# Patient Record
Sex: Male | Born: 1995 | Race: White | Hispanic: No | Marital: Single | Smoking: Never smoker
Health system: Southern US, Community
[De-identification: ages and names within clinical notes are randomized; demographics above are authoritative.]

---
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
## 2020-09-13 ENCOUNTER — Ambulatory Visit
Admission: EM | Admit: 2020-09-13 | Discharge: 2020-09-13 | Disposition: A | Discharge status: Home / Self Care | Payer: Self-pay | Attending: Family Medicine | Admitting: Family Medicine

## 2020-09-13 ENCOUNTER — Other Ambulatory Visit: Payer: Self-pay

## 2020-09-13 DIAGNOSIS — S0181XA Laceration without foreign body of other part of head, initial encounter: Secondary | ICD-10-CM

## 2020-09-13 MED ORDER — KETOROLAC TROMETHAMINE 10 MG PO TABS: 10.0000 mg | ORAL_TABLET | Freq: Four times a day (QID) | ORAL | 0 refills | Status: AC | PRN

## 2020-09-13 NOTE — Discharge Instructions (Signed)
Keep clean.  Medication as needed for pain.  Rest. Ice.   Sutures out in 5-7 days.  Take care  Dr. Adriana Simas

## 2020-09-13 NOTE — ED Provider Notes (Signed)
[facility]    CSN: [id number] Arrival date & time: 09/13/20  1151      History   Chief Complaint Chief Complaint  Patient presents with  . Facial Injury   HPI  24 year old male presents with the above complaint.   Patient states that last night he was with some friends. He states that he was "running around" and inadvertently tripped and hit his face on a metal pole. He has a laceration to the bridge of the nose and to the left cheek (near the nasolabial fold). Bleeding well controlled at this time. Tetanus was given in the past 2-3 years per his report. He reports headache and associated facial pain. Pain is 9/10 in severity.   Home Medications    Prior to Admission medications   Medication Sig Start Date End Date Taking? Authorizing Provider  DULoxetine (CYMBALTA) 30 MG capsule Take 1 capsule (30 mg total) by mouth 2 (two) times daily. 04/30/15   Gayland Curry, MD  ketorolac (TORADOL) 10 MG tablet Take 1 tablet (10 mg total) by mouth every 6 (six) hours as needed for moderate pain or severe pain. 09/13/20   Tommie Sams, DO  lisdexamfetamine (VYVANSE) 30 MG capsule Take 1 capsule (30 mg total) by mouth daily after breakfast. 04/30/15   Gayland Curry, MD  ranitidine (ZANTAC) 150 MG tablet Take 150 mg by mouth daily.     [provider]    Family History Family History  Problem Relation Age of Onset  . Cancer Mother   . Healthy Father     Social History Social History   Tobacco Use  . Smoking status: Never Smoker  . Smokeless tobacco: Never Used  Substance Use Topics  . Alcohol use: No  . Drug use: No     Allergies   Shellfish allergy, Other, and Sunflower seed [sunflower oil]   Review of Systems Review of Systems  Skin: Positive for wound.   Physical Exam Triage Vital Signs ED Triage Vitals  Enc Vitals Group     BP 09/13/20 1217 106/64     Pulse Rate 09/13/20 1217 77     Resp 09/13/20 1217 16     Temp 09/13/20 1217  98.6 F (37 C)     Temp Source 09/13/20 1217 Oral     SpO2 09/13/20 1217 99 %     Weight --      Height --      Head Circumference --      Peak Flow --      Pain Score 09/13/20 1215 9     Pain Loc --      Pain Edu? --      Excl. in GC? --    Updated Vital Signs BP 106/64 (BP Location: Left Arm)   Pulse 77   Temp 98.6 F (37 C) (Oral)   Resp 16   SpO2 99%   Visual Acuity Right Eye Distance:   Left Eye Distance:   Bilateral Distance:    Right Eye Near:   Left Eye Near:    Bilateral Near:     Physical Exam Constitutional:      Comments: Thin male, NAD.  HENT:     Nose:     Comments: Curvilinear laceration (1.5 cm) at the bridge of the nose.  Maxillary tenderness.  Small laceration to the left cheek near the nasolabial fold.     Mouth/Throat:     Pharynx: Oropharynx is clear.  Eyes:  General:        Right eye: No discharge.        Left eye: No discharge.     Conjunctiva/sclera: Conjunctivae normal.  Cardiovascular:     Rate and Rhythm: Normal rate and regular rhythm.  Pulmonary:     Effort: Pulmonary effort is normal. No respiratory distress.  Neurological:     Mental Status: He is alert.    UC Treatments / Results  Labs (all labs ordered are listed, but only abnormal results are displayed) Labs Reviewed - No data to display  EKG   Radiology No results found.  Procedures Laceration Repair  Date/Time: 09/13/2020 11:05 PM Performed by: Tommie Sams, DO Authorized by: Tommie Sams, DO   Consent:    Consent obtained:  Verbal   Consent given by:  Patient Anesthesia (see MAR for exact dosages):    Anesthesia method:  Local infiltration   Local anesthetic:  Lidocaine 1% WITH epi Laceration details:    Location:  Face   Face location:  Nose   Length (cm):  1.5 Repair type:    Repair type:  Simple Pre-procedure details:    Preparation:  Patient was prepped and draped in usual sterile fashion Exploration:    Hemostasis achieved with:  Direct  pressure   Contaminated: no   Treatment:    Area cleansed with:  Betadine   Amount of cleaning:  Standard Skin repair:    Repair method:  Sutures   Suture size:  6-0   Suture material:  Prolene   Suture technique:  Simple interrupted   Number of sutures:  5 Approximation:    Approximation:  Close Post-procedure details:    Patient tolerance of procedure:  Tolerated well, no immediate complications Laceration Repair  Date/Time: 09/13/2020 11:07 PM Performed by: Tommie Sams, DO Authorized by: Tommie Sams, DO   Consent:    Consent obtained:  Verbal   Consent given by:  Patient Anesthesia (see MAR for exact dosages):    Anesthesia method:  None Laceration details:    Location:  Face   Face location:  L cheek   Length (cm):  0.5 Repair type:    Repair type:  Simple Exploration:    Hemostasis achieved with:  Direct pressure   Contaminated: no   Treatment:    Area cleansed with:  Betadine Skin repair:    Repair method:  Tissue adhesive Approximation:    Approximation:  Close Post-procedure details:    Patient tolerance of procedure:  Tolerated well, no immediate complications   (including critical care time)  Medications Ordered in UC Medications - No data to display  Initial Impression / Assessment and Plan / UC Course  I have reviewed the triage vital signs and the nursing notes.  Pertinent labs & imaging results that were available during my care of the patient were reviewed by me and considered in my medical decision making (see chart for details).    24 year old male presents with 2 facial lacerations (bridge of nose and left cheek). Closed as above. Sutures out in 5-7 days. Toradol as needed for pain. Discussed imaging and patient elected to not proceed with imaging.   Final Clinical Impressions(s) / UC Diagnoses   Final diagnoses:  Facial laceration, initial encounter     Discharge Instructions     Keep clean.  Medication as needed for  pain.  Rest. Ice.   Sutures out in 5-7 days.  Take care  Dr. Adriana Simas  ED Prescriptions    Medication Sig Dispense Auth. Provider   ketorolac (TORADOL) 10 MG tablet Take 1 tablet (10 mg total) by mouth every 6 (six) hours as needed for moderate pain or severe pain. 20 tablet Tommie Sams, DO     PDMP not reviewed this encounter.   Tommie Sams, [place] 09/13/20 2318

## 2020-09-13 NOTE — ED Triage Notes (Signed)
Patient presents to Urgent Care with complaints of falling into a block since last night. Patient reports he is not sure if he passed out after it happened, states it took a while before he could hear people talking after he fell. Pt has a bandage over the wound upon arrival, states it has been bleeding since it happened. Thinks he is up to date on his tetanus shot.

## 2020-09-23 ENCOUNTER — Ambulatory Visit
Admission: EM | Admit: 2020-09-23 | Discharge: 2020-09-23 | Disposition: A | Discharge status: Home / Self Care | Payer: Self-pay

## 2020-09-23 ENCOUNTER — Other Ambulatory Visit: Payer: Self-pay

## 2020-09-23 NOTE — ED Notes (Signed)
Edges well approximated without sx infection

## 2020-09-23 NOTE — ED Notes (Signed)
5 sutures removed from bridge of nose

## 2020-09-23 NOTE — ED Triage Notes (Signed)
Pt with 5 sutures to bridge of nose. "I was supposed to come on Monday but was out of town."

## 2021-06-08 ENCOUNTER — Emergency Department
Admission: EM | Admit: 2021-06-08 | Discharge: 2021-06-08 | Disposition: A | Discharge status: Home / Self Care | Payer: Self-pay | Attending: Emergency Medicine | Admitting: Emergency Medicine

## 2021-06-08 ENCOUNTER — Other Ambulatory Visit: Payer: Self-pay

## 2021-06-08 ENCOUNTER — Emergency Department: Payer: Self-pay

## 2021-06-08 DIAGNOSIS — M7989 Other specified soft tissue disorders: Secondary | ICD-10-CM

## 2021-06-08 DIAGNOSIS — I83811 Varicose veins of right lower extremities with pain: Secondary | ICD-10-CM | POA: Insufficient documentation

## 2021-06-08 DIAGNOSIS — M79604 Pain in right leg: Secondary | ICD-10-CM

## 2021-06-08 MED ORDER — NAPROXEN 500 MG PO TABS: 500.0000 mg | ORAL_TABLET | Freq: Two times a day (BID) | ORAL | 0 refills | Status: AC

## 2021-06-08 NOTE — ED Provider Notes (Signed)
[facility] Emergency Department Provider Note  ____________________________________________   Event Date/Time   First MD Initiated Contact with Patient 06/08/21 1157     (approximate)  I have reviewed the triage vital signs and the nursing notes.   HISTORY  Chief Complaint Varicose Veins and Leg Swelling   HPI Russell Lloyd is a 25 y.o. male presents to the ED with complaint of right lower extremity pain.  Patient states that he has a history of varicose veins but for the last 4 days his leg has been swelling and last evening the pain was such that he was unable to sleep.  He has been wearing a compression sock on his right leg which has not helped.  Patient denies any trauma to his leg, smoking, traveling or previous DVT.  He rates his pain as an 8 out of 10.         History reviewed. No pertinent past medical history.  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 No pertinent surgical history.  Prior to Admission medications   Medication Sig Start Date End Date Taking? Authorizing Provider  naproxen (NAPROSYN) 500 MG tablet Take 1 tablet (500 mg total) by mouth 2 (two) times daily with a meal. 06/08/21  Yes Bridget Hartshorn L, PA-C  DULoxetine (CYMBALTA) 30 MG capsule Take 1 capsule (30 mg total) by mouth 2 (two) times daily. 04/30/15   Gayland Curry, MD  ketorolac (TORADOL) 10 MG tablet Take 1 tablet (10 mg total) by mouth every 6 (six) hours as needed for moderate pain or severe pain. 09/13/20   Tommie Sams, DO  lisdexamfetamine (VYVANSE) 30 MG  capsule Take 1 capsule (30 mg total) by mouth daily after breakfast. 04/30/15   Gayland Curry, MD  ranitidine (ZANTAC) 150 MG tablet Take 150 mg by mouth daily.     [provider]    Allergies Shellfish allergy, Other, and Sunflower seed [sunflower oil]  Family History  Problem Relation Age of Onset   Cancer Mother    Healthy Father     Social History Social History   Tobacco Use   Smoking status: Never   Smokeless tobacco: Never  Substance Use Topics   Alcohol use: No   Drug use: No    Review of Systems Constitutional: No fever/chills Eyes: No visual changes. ENT: No sore throat. Cardiovascular: Denies chest pain. Respiratory: Denies shortness of breath. Gastrointestinal: No abdominal pain.  No nausea, no vomiting.   Musculoskeletal: Positive for right lower extremity pain.  Positive varicose veins. Skin: Negative for rash. Neurological: Negative for headaches, focal weakness or numbness.   ____________________________________________   PHYSICAL EXAM:  VITAL SIGNS: ED Triage Vitals  Enc Vitals Group     BP 06/08/21 1036 107/72     Pulse Rate 06/08/21 1036 (!) 57     Resp 06/08/21 1036 18     Temp 06/08/21 1036 98 F (36.7 C)     Temp Source 06/08/21 1036 Oral     SpO2 06/08/21 1036 100 %     Weight 06/08/21 1035 140 lb (63.5 kg)     Height 06/08/21 1035 6\' 1"  (1.854 m)  Head Circumference --      Peak Flow --      Pain Score 06/08/21 1035 8     Pain Loc --      Pain Edu? --      Excl. in GC? --     Constitutional: Alert and oriented. Well appearing and in no acute distress. Eyes: Conjunctivae are normal.  Head: Atraumatic. Neck: No stridor.   Cardiovascular: Normal rate, regular rhythm. Grossly normal heart sounds.  Good peripheral circulation. Respiratory: Normal respiratory effort.  No retractions. Lungs CTAB. Gastrointestinal: Soft and nontender. No distention.  Musculoskeletal: Right lower extremity with tenderness on  palpation of the calf.  Mild Denna Haggard' sign present.  No erythema or warmth is noted however there is moderate tenderness and varicose veins are noted to the medial aspect of the lower extremity. Neurologic:  Normal speech and language. No gross focal neurologic deficits are appreciated. No gait instability. Skin:  Skin is warm, dry and intact. No rash noted. Psychiatric: Mood and affect are normal. Speech and behavior are normal.  ____________________________________________   LABS (all labs ordered are listed, but only abnormal results are displayed)  Labs Reviewed - No data to display ____________________________________________ ____________________________________________  RADIOLOGY Beaulah Corin, personally viewed and evaluated these images (plain radiographs) as part of my medical decision making, as well as reviewing the written report by the radiologist.    Official radiology report(s): US Venous Img Lower Unilateral Right (DVT)  Result Date: 06/08/2021 CLINICAL DATA:  Right lower extremity pain and swelling EXAM: RIGHT LOWER EXTREMITY VENOUS DOPPLER ULTRASOUND TECHNIQUE: Gray-scale sonography with compression, as well as color and duplex ultrasound, were performed to evaluate the deep venous system(s) from the level of the common femoral vein through the popliteal and proximal calf veins. COMPARISON:  None. FINDINGS: VENOUS Normal compressibility of the common femoral, superficial femoral, and popliteal veins, as well as the visualized calf veins. Visualized portions of profunda femoral vein and great saphenous vein unremarkable. No filling defects to suggest DVT on grayscale or color Doppler imaging. Doppler waveforms show normal direction of venous flow, normal respiratory plasticity and response to augmentation. Limited views of the contralateral common femoral vein are unremarkable. OTHER None. Limitations: none IMPRESSION: Negative. Electronically Signed   By: Malachy Moan M.D.   On: 06/08/2021 14:05    ____________________________________________   PROCEDURES  Procedure(s) performed (including Critical Care):  Procedures   ____________________________________________   INITIAL IMPRESSION / ASSESSMENT AND PLAN / ED COURSE  As part of my medical decision making, I reviewed the following data within the electronic MEDICAL RECORD NUMBER Notes from prior ED visits and [place] Controlled Substance Database  25 year old male presents to the ED with complaint of right lower extremity pain and history of varicose veins.  He states for the last several days he has had more discomfort in his right leg than usual.  Patient has been using compression sock which has not been helping and last evening had increased pain.  Ultrasound of the right leg was negative for DVT and patient was made aware.  A prescription for naproxen 500 mg twice daily was sent to the pharmacy and patient will continue using his compression sock when he is at work.  We discussed following up with a vein and vascular specialist as he is very young and has varicose veins and is concerned that these may be getting worse.  ____________________________________________   FINAL CLINICAL IMPRESSION(S) / ED DIAGNOSES  Final diagnoses:  Pain and swelling  of lower extremity, right  Varicose veins of right lower extremity with pain     ED Discharge Orders          Ordered    naproxen (NAPROSYN) 500 MG tablet  2 times daily with meals        06/08/21 1438             Note:  This document was prepared using Dragon voice recognition software and may include unintentional dictation errors.    Tommi Rumps, PA-C 06/08/21 1542    Chesley Noon, MD 06/09/21 [id number]

## 2021-06-08 NOTE — ED Triage Notes (Signed)
Pt comes pov with varicose veins and right leg pain. States that there is swelling under his knee and on his calf. Denies hx of blood clots.

## 2021-06-08 NOTE — Discharge Instructions (Addendum)
Follow-up with Dr. Wyn Quaker who is the vein specialist and I spoke about.  You may continue using your compression socks which will help with edema in your lower extremity.  Also prescription for an anti-inflammatory was sent to the pharmacy.  This medication is to be taken with food and should help with some of your muscle skeletal pain.

## 2021-12-18 ENCOUNTER — Emergency Department
Admission: EM | Admit: 2021-12-18 | Discharge: 2021-12-18 | Disposition: A | Discharge status: Home / Self Care | Payer: Medicaid Other | Attending: Emergency Medicine | Admitting: Emergency Medicine

## 2021-12-18 ENCOUNTER — Other Ambulatory Visit: Payer: Self-pay

## 2021-12-18 DIAGNOSIS — K529 Noninfective gastroenteritis and colitis, unspecified: Secondary | ICD-10-CM | POA: Insufficient documentation

## 2021-12-18 DIAGNOSIS — R42 Dizziness and giddiness: Secondary | ICD-10-CM | POA: Insufficient documentation

## 2021-12-18 LAB — CBC WITH DIFFERENTIAL/PLATELET
Abs Immature Granulocytes: 0.02 10*3/uL (ref 0.00–0.07)
Basophils Absolute: 0 10*3/uL (ref 0.0–0.1)
Basophils Relative: 0 %
Eosinophils Absolute: 0 10*3/uL (ref 0.0–0.5)
Eosinophils Relative: 1 %
HCT: 41.4 % (ref 39.0–52.0)
Hemoglobin: 14.1 g/dL (ref 13.0–17.0)
Immature Granulocytes: 0 %
Lymphocytes Relative: 7 %
Lymphs Abs: 0.4 10*3/uL — ABNORMAL LOW (ref 0.7–4.0)
MCH: 29.9 pg (ref 26.0–34.0)
MCHC: 34.1 g/dL (ref 30.0–36.0)
MCV: 87.9 fL (ref 80.0–100.0)
Monocytes Absolute: 0.4 10*3/uL (ref 0.1–1.0)
Monocytes Relative: 6 %
Neutro Abs: 5.7 10*3/uL (ref 1.7–7.7)
Neutrophils Relative %: 86 %
Platelets: 327 10*3/uL (ref 150–400)
RBC: 4.71 MIL/uL (ref 4.22–5.81)
RDW: 12.3 % (ref 11.5–15.5)
WBC: 6.6 10*3/uL (ref 4.0–10.5)
nRBC: 0 % (ref 0.0–0.2)

## 2021-12-18 LAB — COMPREHENSIVE METABOLIC PANEL
ALT: 15 U/L (ref 0–44)
AST: 26 U/L (ref 15–41)
Albumin: 3.6 g/dL (ref 3.5–5.0)
Alkaline Phosphatase: 42 U/L (ref 38–126)
Anion gap: 5 (ref 5–15)
BUN: 11 mg/dL (ref 6–20)
CO2: 29 mmol/L (ref 22–32)
Calcium: 9.1 mg/dL (ref 8.9–10.3)
Chloride: 104 mmol/L (ref 98–111)
Creatinine, Ser: 0.82 mg/dL (ref 0.61–1.24)
GFR, Estimated: >60 mL/min (ref >60)
Glucose, Bld: 121 mg/dL — ABNORMAL HIGH (ref 70–99)
Potassium: 3.9 mmol/L (ref 3.5–5.1)
Sodium: 138 mmol/L (ref 135–145)
Total Bilirubin: 1.1 mg/dL (ref 0.3–1.2)
Total Protein: 6.5 g/dL (ref 6.5–8.1)

## 2021-12-18 LAB — LIPASE, BLOOD: Lipase: 26 U/L (ref 11–51)

## 2021-12-18 MED ORDER — ONDANSETRON 4 MG PO TBDP: 4.0000 mg | ORAL_TABLET | Freq: Three times a day (TID) | ORAL | 0 refills | Status: AC | PRN

## 2021-12-18 MED ORDER — DROPERIDOL 2.5 MG/ML IJ SOLN
1.2500 mg | Freq: Once | INTRAMUSCULAR | Status: AC
  Administered 2021-12-18: 1.25 mg via INTRAVENOUS
  Filled 2021-12-18: qty 2

## 2021-12-18 MED ORDER — LACTATED RINGERS IV BOLUS
1000.0000 mL | Freq: Once | INTRAVENOUS | Status: AC
  Administered 2021-12-18: 1000 mL via INTRAVENOUS

## 2021-12-18 NOTE — ED Notes (Signed)
Discharge instructions, script and follow-up info provided to patient. Patient verbalized understanding. Patient ambulatory with a steady gait out to the waiting room

## 2021-12-18 NOTE — ED Provider Notes (Signed)
[facility] Provider Note    Event Date/Time   First MD Initiated Contact with Patient 12/18/21 1928     (approximate)   History   Emesis and Diarrhea   HPI  Rohaan Bonawitz is a 26 y.o. male who presents to the ED for evaluation of Emesis and Diarrhea   Review outpatient [facility] visit from 2018.  Not much more contemporary medical history.  History of ADHD and depression.  Patient presents to the ED for evaluation of 1 day of recurrent emesis.  He reports primarily 20+ episodes of emesis, with associated abdominal cramping and nausea.  Reports a couple episodes of watery diarrhea alongside this, but minimizes these.  Denies any blood such as hematemesis or hematochezia or melena.  Reports unable to keep any liquids down, and associated dizziness without syncope, so due to this he presents to the ED for evaluation.  Physical Exam   Triage Vital Signs: ED Triage Vitals [12/18/21 1925]  Enc Vitals Group     BP 102/75     Pulse Rate (!) 107     Resp 19     Temp 99.4 F (37.4 C)     Temp Source Oral     SpO2 100 %     Weight 125 lb (56.7 kg)     Height 6\' 1"  (1.854 m)     Head Circumference      Peak Flow      Pain Score 6     Pain Loc      Pain Edu?      Excl. in [place]?     Most recent vital signs: Vitals:   12/18/21 1925  BP: 102/75  Pulse: (!) 107  Resp: 19  Temp: 99.4 F (37.4 C)  SpO2: 100%    General: Awake, no distress.  Noticeably skinny.  Moist mucous membranes. CV:  Good peripheral perfusion.  Tachycardic and regular Resp:  Normal effort.  Abd:  No distention.  Soft and benign throughout. MSK:  No deformity noted.  Neuro:  No focal deficits appreciated. Other:     ED Results / Procedures / Treatments   Labs (all labs ordered are listed, but only abnormal results are displayed) Labs Reviewed  CBC WITH DIFFERENTIAL/PLATELET - Abnormal; Notable for the following components:      Result Value   Lymphs Abs 0.4 (*)    All  other components within normal limits  COMPREHENSIVE METABOLIC PANEL - Abnormal; Notable for the following components:   Glucose, Bld 121 (*)    All other components within normal limits  LIPASE, BLOOD    EKG   RADIOLOGY   Official radiology report(s): No results found.  PROCEDURES and INTERVENTIONS:  Procedures  Medications  lactated ringers bolus 1,000 mL (1,000 mLs Intravenous New Bag/Given 12/18/21 2026)  droperidol (INAPSINE) 2.5 MG/ML injection 1.25 mg (1.25 mg Intravenous Given 12/18/21 2025)     IMPRESSION / MDM / ASSESSMENT AND PLAN / ED COURSE  I reviewed the triage vital signs and the nursing notes.  26 year old male presents to the ED with recurrent emesis and diarrhea, possibly gastroenteritis versus cannabis hyperemesis syndrome, ultimately suitable to outpatient management.  Tachycardic on arrival and this resolved with rehydration and antiemetics.  Reports regularly smoking cannabis and this is a possible etiology of his recurrent emesis today.  Symptoms do resolve with droperidol.  Blood work without significant electrolyte derangements, leukocytosis, signs of sepsis or pancreatitis.  He is subsequently tolerating p.o. intake and suitable for outpatient  management.  Clinical Course as of 12/18/21 2108  Nancy Fetter Dec 18, 2021  2053 Reassessed.  Feeling better. [DS]    Clinical Course User Index [DS] Vladimir Crofts, MD     FINAL CLINICAL IMPRESSION(S) / ED DIAGNOSES   Final diagnoses:  Gastroenteritis     Rx / DC Orders   ED Discharge Orders          Ordered    ondansetron (ZOFRAN-ODT) 4 MG disintegrating tablet  Every 8 hours PRN        12/18/21 2107             Note:  This document was prepared using Dragon voice recognition software and may include unintentional dictation errors.   Vladimir Crofts, MD 12/18/21 2108

## 2021-12-18 NOTE — Discharge Instructions (Signed)
Please take Tylenol and ibuprofen/Advil for your pain.  It is safe to take them together, or to alternate them every few hours.  Take up to 1000mg  of Tylenol at a time, up to 4 times per day.  Do not take more than 4000 mg of Tylenol in 24 hours.  For ibuprofen, take 400-600 mg, 4-5 times per day.  Use the Zofran as needed for nausea and vomiting.  This is at the [facility] in [place]  Return to the ED with any worsening symptoms despite these measures

## 2021-12-18 NOTE — ED Triage Notes (Signed)
Pt presents to ER c/o n/v/d and generalized cold symptoms that started around 0500 today.  Pt states he has been unable to keep any food or drink down pt denies any sick contacts, denies any new foods today.  Pt A&O x4 at this time in NAD, and ambulatory to triage.

## 2022-02-04 ENCOUNTER — Other Ambulatory Visit: Payer: Self-pay

## 2022-02-04 ENCOUNTER — Emergency Department
Admission: EM | Admit: 2022-02-04 | Discharge: 2022-02-04 | Disposition: A | Discharge status: Home / Self Care | Payer: Medicaid Other | Attending: Emergency Medicine | Admitting: Emergency Medicine

## 2022-02-04 ENCOUNTER — Emergency Department: Payer: Medicaid Other

## 2022-02-04 ENCOUNTER — Encounter: Payer: Self-pay | Admitting: Emergency Medicine

## 2022-02-04 DIAGNOSIS — M25522 Pain in left elbow: Secondary | ICD-10-CM | POA: Insufficient documentation

## 2022-02-04 DIAGNOSIS — W208XXA Other cause of strike by thrown, projected or falling object, initial encounter: Secondary | ICD-10-CM | POA: Insufficient documentation

## 2022-02-04 MED ORDER — MELOXICAM 15 MG PO TABS: 15.0000 mg | ORAL_TABLET | Freq: Every day | ORAL | 2 refills | Status: AC

## 2022-02-04 NOTE — ED Triage Notes (Signed)
Pt reports with left elbow pain reports a big metal pipe fell on his elbow 2 days ago. Pt reports pain radiating to his upper arm. Pt talks in complete sentences no distress noted. ?

## 2022-02-04 NOTE — ED Provider Notes (Signed)
? ?[facility] ?Provider Note ? ?Patient Contact: 11:20 PM (approximate) ? ? ?History  ? ?Elbow Pain ? ? ?HPI ? ?Russell Lloyd is a 26 y.o. male   presents to the emergency department with left elbow pain after a metal pipe fell on patient's elbow 2 days ago.  Patient has had difficulty flexing and extending elbow since injury occurred.  No similar injuries in the past.  Patient is right-hand dominant.  No numbness or tingling in the left upper extremity. ? ?  ? ? ?Physical Exam  ? ?Triage Vital Signs: ?ED Triage Vitals  ?Enc Vitals Group  ?   BP 02/04/22 2232 123/71  ?   Pulse Rate 02/04/22 2232 85  ?   Resp 02/04/22 2232 19  ?   Temp 02/04/22 2232 98.6 ?F (37 ?C)  ?   Temp src --   ?   SpO2 02/04/22 2232 100 %  ?   Weight 02/04/22 2234 130 lb (59 kg)  ?   Height 02/04/22 2234 6\' 1"  (1.854 m)  ?   Head Circumference --   ?   Peak Flow --   ?   Pain Score 02/04/22 2233 4  ?   Pain Loc --   ?   Pain Edu? --   ?   Excl. in [place]? --   ? ? ?Most recent vital signs: ?Vitals:  ? 02/04/22 2232  ?BP: 123/71  ?Pulse: 85  ?Resp: 19  ?Temp: 98.6 ?F (37 ?C)  ?SpO2: 100%  ? ? ? ?General: Alert and in no acute distress. ?Cardiovascular:  Good peripheral perfusion ?Respiratory: Normal respiratory effort without tachypnea or retractions. Lungs CTAB. Good air entry to the bases with no decreased or absent breath sounds. ?Gastrointestinal: Bowel sounds ?4 quadrants. Soft and nontender to palpation. No guarding or rigidity. No palpable masses. No distention. No CVA tenderness. ?Musculoskeletal: Patient performs limited range of motion at the left elbow likely due to pain.  He can move all 5 left fingers.  Palpable radial and ulnar pulses bilaterally and symmetrically.  Capillary refill less than 2 seconds on the left. ?Neurologic:  No gross focal neurologic deficits are appreciated.  ?Skin:   No rash noted ?Other: ? ? ?ED Results / Procedures / Treatments  ? ?Labs ?(all labs ordered are listed, but only abnormal  results are displayed) ?Labs Reviewed - No data to display ? ? ? ? ?RADIOLOGY ? ?I personally viewed and evaluated these images as part of my medical decision making, as well as reviewing the written report by the radiologist. ? ?ED Provider Interpretation: I personally reviewed x-rays of the left elbow and agree with radiologist interpretation.  No acute bony abnormality was visualized. ? ? ?PROCEDURES: ? ?Critical Care performed: No ? ?Procedures ? ? ?MEDICATIONS ORDERED IN ED: ?Medications - No data to display ? ? ?IMPRESSION / MDM / ASSESSMENT AND PLAN / ED COURSE  ?I reviewed the triage vital signs and the nursing notes. ?             ?               ? ?Differential diagnosis includes, but is not limited to, elbow contusion, ligamentous injury, fracture... ? ?26 year old male presents to the emergency department with acute left elbow pain after contusion type injury. ? ?Vital signs are reassuring at triage.  On physical exam, patient was alert, active and nontoxic-appearing.  He performed limited range of motion at the left elbow due to pain.  I personally reviewed x-rays of the left elbow and there was no signs of acute fracture.  Patient was placed in a sling for comfort and referred to orthopedics, Dr. Hyacinth Meeker ? ?  ? ? ?FINAL CLINICAL IMPRESSION(S) / ED DIAGNOSES  ? ?Final diagnoses:  ?Left elbow pain  ? ? ? ?Rx / DC Orders  ? ?ED Discharge Orders   ? ?      Ordered  ?  meloxicam (MOBIC) 15 MG tablet  Daily       ? 02/04/22 2316  ? ?  ?  ? ?  ? ? ? ?Note:  This document was prepared using Dragon voice recognition software and may include unintentional dictation errors. ?  ?Orvil Feil, PA-C ?02/04/22 2322 ? ?  ?Chesley Noon, MD ?02/05/22 1102 ? ?
